# Patient Record
Sex: Male | Born: 1960 | Race: White | Hispanic: No | Marital: Single | Smoking: Former smoker
Health system: Southern US, Community
[De-identification: ages and names within clinical notes are randomized; demographics above are authoritative.]

## PROBLEM LIST (undated history)

## (undated) DIAGNOSIS — J189 Pneumonia, unspecified organism: Secondary | ICD-10-CM

## (undated) DIAGNOSIS — K649 Unspecified hemorrhoids: Secondary | ICD-10-CM

## (undated) DIAGNOSIS — C189 Malignant neoplasm of colon, unspecified: Secondary | ICD-10-CM

## (undated) DIAGNOSIS — N289 Disorder of kidney and ureter, unspecified: Secondary | ICD-10-CM

## (undated) DIAGNOSIS — R091 Pleurisy: Secondary | ICD-10-CM

## (undated) DIAGNOSIS — C787 Secondary malignant neoplasm of liver and intrahepatic bile duct: Secondary | ICD-10-CM

---

## 2012-10-25 ENCOUNTER — Encounter (HOSPITAL_BASED_OUTPATIENT_CLINIC_OR_DEPARTMENT_OTHER): Payer: Self-pay | Admitting: *Deleted

## 2012-10-25 ENCOUNTER — Emergency Department (HOSPITAL_BASED_OUTPATIENT_CLINIC_OR_DEPARTMENT_OTHER)
Admission: EM | Admit: 2012-10-25 | Discharge: 2012-10-25 | Disposition: A | Discharge status: Home / Self Care | Payer: Medicaid Other | Attending: Emergency Medicine | Admitting: Emergency Medicine

## 2012-10-25 DIAGNOSIS — K921 Melena: Secondary | ICD-10-CM | POA: Insufficient documentation

## 2012-10-25 DIAGNOSIS — Z8709 Personal history of other diseases of the respiratory system: Secondary | ICD-10-CM | POA: Insufficient documentation

## 2012-10-25 DIAGNOSIS — K649 Unspecified hemorrhoids: Secondary | ICD-10-CM | POA: Insufficient documentation

## 2012-10-25 DIAGNOSIS — Z87448 Personal history of other diseases of urinary system: Secondary | ICD-10-CM | POA: Insufficient documentation

## 2012-10-25 DIAGNOSIS — R197 Diarrhea, unspecified: Secondary | ICD-10-CM | POA: Insufficient documentation

## 2012-10-25 DIAGNOSIS — Z8701 Personal history of pneumonia (recurrent): Secondary | ICD-10-CM | POA: Insufficient documentation

## 2012-10-25 HISTORY — DX: Pleurisy: R09.1

## 2012-10-25 HISTORY — DX: Disorder of kidney and ureter, unspecified: N28.9

## 2012-10-25 HISTORY — DX: Unspecified hemorrhoids: K64.9

## 2012-10-25 HISTORY — DX: Pneumonia, unspecified organism: J18.9

## 2012-10-25 LAB — CBC WITH DIFFERENTIAL/PLATELET
Basophils Relative: 0 % (ref 0–1)
Eosinophils Absolute: 0.1 10*3/uL (ref 0.0–0.7)
HCT: 37.1 % — ABNORMAL LOW (ref 39.0–52.0)
Hemoglobin: 12.6 g/dL — ABNORMAL LOW (ref 13.0–17.0)
Lymphs Abs: 1.3 10*3/uL (ref 0.7–4.0)
MCH: 27.8 pg (ref 26.0–34.0)
MCHC: 34 g/dL (ref 30.0–36.0)
MCV: 81.9 fL (ref 78.0–100.0)
Monocytes Absolute: 0.5 10*3/uL (ref 0.1–1.0)
Monocytes Relative: 8 % (ref 3–12)
Neutrophils Relative %: 70 % (ref 43–77)
RBC: 4.53 MIL/uL (ref 4.22–5.81)

## 2012-10-25 LAB — BASIC METABOLIC PANEL
BUN: 9 mg/dL (ref 6–23)
Creatinine, Ser: 1 mg/dL (ref 0.50–1.35)
GFR calc non Af Amer: 87 mL/min — ABNORMAL LOW (ref >90)
Glucose, Bld: 107 mg/dL — ABNORMAL HIGH (ref 70–99)
Potassium: 3.6 mEq/L (ref 3.5–5.1)

## 2012-10-25 MED ORDER — SODIUM CHLORIDE 0.9 % IV BOLUS (SEPSIS)
1000.0000 mL | Freq: Once | INTRAVENOUS | Status: AC
  Administered 2012-10-25: 1000 mL via INTRAVENOUS

## 2012-10-25 MED ORDER — HYDROCORTISONE 2.5 % RE CREA: TOPICAL_CREAM | RECTAL | Status: AC

## 2012-10-25 MED ORDER — DIPHENOXYLATE-ATROPINE 2.5-0.025 MG PO TABS: 1.0000 | ORAL_TABLET | Freq: Four times a day (QID) | ORAL | Status: AC | PRN

## 2012-10-25 MED ORDER — DIPHENOXYLATE-ATROPINE 2.5-0.025 MG PO TABS
2.0000 | ORAL_TABLET | ORAL | Status: AC
  Administered 2012-10-25: 2 via ORAL
  Filled 2012-10-25: qty 2

## 2012-10-25 NOTE — ED Provider Notes (Signed)
Medical screening examination/treatment/procedure(s) were performed by non-physician practitioner and as supervising physician I was immediately available for consultation/collaboration.   Charles B. Bernette Mayers, MD 10/25/12 2211

## 2012-10-25 NOTE — ED Provider Notes (Signed)
History     CSN: [id number]  Arrival date & time 10/25/12  1924   First MD Initiated Contact with Patient 10/25/12 1951      Chief Complaint  Patient presents with  . Diarrhea    (Consider location/radiation/quality/duration/timing/severity/associated sxs/prior treatment) HPI Comments: Patient is a 52 year old male who presents with a 1 year history of diarrhea. Symptoms started gradually and remained intermittent over the past year. Patient denies known trigger. He has not tried anything for symptoms or seen a physician about this problem. Patient reports associated painful hemorrhoids and occasional blood in stool. No aggravating/alleviating factors. No other associated symptoms.   Patient is a 52 y.o. male presenting with diarrhea.  Diarrhea   Past Medical History  Diagnosis Date  . Hemorrhoid   . Pleurisy   . Pneumonia   . Renal disorder     History reviewed. No pertinent past surgical history.  History reviewed. No pertinent family history.  History  Substance Use Topics  . Smoking status: Never Smoker   . Smokeless tobacco: Not on file  . Alcohol Use: No      Review of Systems  Gastrointestinal: Positive for diarrhea and blood in stool.  All other systems reviewed and are negative.    Allergies  Morphine and related  Home Medications  No current outpatient prescriptions on file.  BP 148/99  Pulse 92  Temp(Src) 98.6 F (37 C) (Oral)  Resp 20  Ht 5' 10.5" (1.791 m)  Wt 215 lb (97.523 kg)  BMI 30.4 kg/m2  SpO2 98%  Physical Exam  Nursing note and vitals reviewed. Constitutional: He is oriented to person, place, and time. He appears well-developed and well-nourished. No distress.  HENT:  Head: Normocephalic and atraumatic.  Eyes: Conjunctivae are normal.  Neck: Normal range of motion.  Cardiovascular: Normal rate and regular rhythm.  Exam reveals no gallop and no friction rub.   No murmur heard. Pulmonary/Chest: Effort normal and breath  sounds normal. He has no wheezes. He has no rales. He exhibits no tenderness.  Abdominal: Soft. He exhibits no distension. There is no tenderness. There is no rebound and no guarding.  Genitourinary:  Hemorrhoid noted at 12 o'clock position of anus. No blood noted.   Musculoskeletal: Normal range of motion.  Neurological: He is alert and oriented to person, place, and time. Coordination normal.  Speech is goal-oriented. Moves limbs without ataxia.   Skin: Skin is warm and dry.  Psychiatric: He has a normal mood and affect. His behavior is normal.    ED Course  Procedures (including critical care time)  Labs Reviewed  CBC WITH DIFFERENTIAL - Abnormal; Notable for the following:    Hemoglobin 12.6 (*)    HCT 37.1 (*)    All other components within normal limits  BASIC METABOLIC PANEL - Abnormal; Notable for the following:    Glucose, Bld 107 (*)    GFR calc non Af Amer 87 (*)    All other components within normal limits   No results found.   1. Diarrhea   2. Hemorrhoids       MDM  10:02 PM Labs stable. Patient afebrile with stable vitals. Patient will be discharged with lomotil and instructions for hemorrhoid pain relief. Patient will have recommended GI follow up. Patient instructed to return with worsening or concerning symptoms.         Emilia Beck, [place] 10/25/12 2208

## 2012-10-25 NOTE — ED Notes (Signed)
Pt came to ED with wife that was transported by EMS. Pt states he has a hemorrhoid and has had diarrhea with blood in stool x 1 yr.

## 2013-08-23 ENCOUNTER — Emergency Department (HOSPITAL_BASED_OUTPATIENT_CLINIC_OR_DEPARTMENT_OTHER)
Admission: EM | Admit: 2013-08-23 | Discharge: 2013-08-23 | Disposition: A | Discharge status: Home / Self Care | Payer: Medicaid Other | Attending: Emergency Medicine | Admitting: Emergency Medicine

## 2013-08-23 ENCOUNTER — Emergency Department (HOSPITAL_BASED_OUTPATIENT_CLINIC_OR_DEPARTMENT_OTHER): Payer: Medicaid Other

## 2013-08-23 ENCOUNTER — Encounter (HOSPITAL_BASED_OUTPATIENT_CLINIC_OR_DEPARTMENT_OTHER): Payer: Self-pay | Admitting: Emergency Medicine

## 2013-08-23 DIAGNOSIS — Z85038 Personal history of other malignant neoplasm of large intestine: Secondary | ICD-10-CM | POA: Insufficient documentation

## 2013-08-23 DIAGNOSIS — C189 Malignant neoplasm of colon, unspecified: Secondary | ICD-10-CM | POA: Insufficient documentation

## 2013-08-23 DIAGNOSIS — Z87891 Personal history of nicotine dependence: Secondary | ICD-10-CM | POA: Insufficient documentation

## 2013-08-23 DIAGNOSIS — Z79899 Other long term (current) drug therapy: Secondary | ICD-10-CM | POA: Insufficient documentation

## 2013-08-23 DIAGNOSIS — Z8709 Personal history of other diseases of the respiratory system: Secondary | ICD-10-CM | POA: Insufficient documentation

## 2013-08-23 DIAGNOSIS — IMO0002 Reserved for concepts with insufficient information to code with codable children: Secondary | ICD-10-CM | POA: Insufficient documentation

## 2013-08-23 DIAGNOSIS — Y929 Unspecified place or not applicable: Secondary | ICD-10-CM | POA: Insufficient documentation

## 2013-08-23 DIAGNOSIS — Y9389 Activity, other specified: Secondary | ICD-10-CM | POA: Insufficient documentation

## 2013-08-23 DIAGNOSIS — Z8679 Personal history of other diseases of the circulatory system: Secondary | ICD-10-CM | POA: Insufficient documentation

## 2013-08-23 DIAGNOSIS — S298XXA Other specified injuries of thorax, initial encounter: Secondary | ICD-10-CM | POA: Insufficient documentation

## 2013-08-23 DIAGNOSIS — Z8701 Personal history of pneumonia (recurrent): Secondary | ICD-10-CM | POA: Insufficient documentation

## 2013-08-23 DIAGNOSIS — C787 Secondary malignant neoplasm of liver and intrahepatic bile duct: Secondary | ICD-10-CM | POA: Insufficient documentation

## 2013-08-23 DIAGNOSIS — S299XXA Unspecified injury of thorax, initial encounter: Secondary | ICD-10-CM

## 2013-08-23 DIAGNOSIS — X500XXA Overexertion from strenuous movement or load, initial encounter: Secondary | ICD-10-CM | POA: Insufficient documentation

## 2013-08-23 DIAGNOSIS — Z87448 Personal history of other diseases of urinary system: Secondary | ICD-10-CM | POA: Insufficient documentation

## 2013-08-23 HISTORY — DX: Malignant neoplasm of colon, unspecified: C18.9

## 2013-08-23 HISTORY — DX: Secondary malignant neoplasm of liver and intrahepatic bile duct: C78.7

## 2013-08-23 MED ORDER — OXYCODONE-ACETAMINOPHEN 5-325 MG PO TABS: 2.0000 | ORAL_TABLET | ORAL | Status: AC | PRN

## 2013-08-23 MED ORDER — HYDROMORPHONE HCL PF 1 MG/ML IJ SOLN
1.0000 mg | Freq: Once | INTRAMUSCULAR | Status: AC
  Administered 2013-08-23: 1 mg via INTRAMUSCULAR
  Filled 2013-08-23: qty 1

## 2013-08-23 NOTE — Discharge Instructions (Signed)
Return to the ED with any concerns including increased pain, difficulty breathing, fever/chills, or any other alarming symptoms  You should use the incentive spirometer approximately 10 times every hour

## 2013-08-23 NOTE — ED Notes (Signed)
Pt reports he crawled across the back seat of the car and now has pain on right lower rib margin- sore to touch

## 2013-08-23 NOTE — ED Provider Notes (Signed)
CSN: [id number]     Arrival date & time 08/23/13  1553 History  This chart was scribed for Threasa Beards, MD by Marcha Dutton, ED Scribe. This patient was seen in room MH11/MH11 and the patient's care was started at 4:28 PM.    Chief Complaint  Patient presents with  . Rib Injury      Patient is a 53 y.o. male presenting with chest pain. The history is provided by the patient. No language interpreter was used.  Chest Pain Pain location:  R chest Pain quality: aching and dull   Pain radiates to the back: no   Pain severity:  Moderate Onset quality:  Sudden Duration:  1 day Timing:  Constant Progression:  Unchanged Chronicity:  New Context: movement   Context comment:   Pt reports he reached across the back seat of the car to lock the opposite door when she felt a pain.  Relieved by:  Rest Worsened by:  Deep breathing, movement and exertion Pt reports he has colon cancer that has spread to the liver and has been on chemo. Pt reports he is allergic to morphine. Pt states he takes percocet already for pain denies ever taking more than 2 per day.  Past Medical History  Diagnosis Date  . Hemorrhoid   . Pleurisy   . Pneumonia   . Renal disorder   . Colon cancer metastasized to liver    History reviewed. No pertinent past surgical history. No family history on file. History  Substance Use Topics  . Smoking status: Former Research scientist (life sciences)  . Smokeless tobacco: Never Used  . Alcohol Use: No    Review of Systems  Cardiovascular: Positive for chest pain.  All other systems reviewed and are negative.     Allergies  Morphine and related  Home Medications   Current Outpatient Rx  Name  Route  Sig  Dispense  Refill  . hydrochlorothiazide (HYDRODIURIL) 12.5 MG tablet   Oral   Take 12.5 mg by mouth daily.         . methadone (DOLOPHINE) 5 MG tablet   Oral   Take 5 mg by mouth daily.         Marland Kitchen oxyCODONE-acetaminophen (PERCOCET) 10-325 MG per tablet   Oral   Take 1  tablet by mouth every 4 (four) hours as needed for pain.         . diphenoxylate-atropine (LOMOTIL) 2.5-0.025 MG per tablet   Oral   Take 1 tablet by mouth 4 (four) times daily as needed for diarrhea or loose stools.   30 tablet   0   . hydrocortisone (ANUSOL-HC) 2.5 % rectal cream      Apply rectally 2 times daily   30 g   0   . oxyCODONE-acetaminophen (ROXICET) 5-325 MG per tablet   Oral   Take 2 tablets by mouth every 4 (four) hours as needed for severe pain.   30 tablet   0    Triage Vitals: BP 175/116  Pulse 82  Temp(Src) 98.2 F (36.8 C) (Oral)  Resp 18  Ht 5\' 10"  (1.778 m)  Wt 210 lb (95.255 kg)  BMI 30.13 kg/m2  SpO2 100%  Physical Exam  Nursing note and vitals reviewed. Constitutional: He appears well-developed and well-nourished. No distress.  HENT:  Head: Normocephalic and atraumatic.  Right Ear: External ear normal.  Left Ear: External ear normal.  Eyes: Conjunctivae are normal. Right eye exhibits no discharge. Left eye exhibits no discharge. No scleral  icterus.  Neck: Neck supple. No tracheal deviation present.  Cardiovascular: Normal rate, regular rhythm and intact distal pulses.   Pulmonary/Chest: Effort normal and breath sounds normal. No stridor. No respiratory distress. He has no wheezes. He has no rales. He exhibits tenderness (Tenderness to palpation over right anterior lower ribs). He exhibits no crepitus.  no crepitus, br sounds symmetric, appears uncomfortable with movement  Abdominal: Soft. Bowel sounds are normal. He exhibits no distension. There is tenderness. There is no rebound and no guarding.  Musculoskeletal: He exhibits no edema and no tenderness.  Neurological: He is alert. He has normal strength. No cranial nerve deficit (no facial droop, extraocular movements intact, no slurred speech) or sensory deficit. He exhibits normal muscle tone. He displays no seizure activity. Coordination normal.  Skin: Skin is warm and dry. No rash noted.   Psychiatric: He has a normal mood and affect.    ED Course  Procedures (including critical care time)  DIAGNOSTIC STUDIES: Oxygen Saturation is 100% on RA, normal by my interpretation.    COORDINATION OF CARE: 4:32 PM- Pt advised of plan for treatment and pt agrees.    Labs Review Labs Reviewed - No data to display Imaging Review No results found.   EKG Interpretation None      MDM   Final diagnoses:  Rib injury    Pt presenting with c/o pain in right lower rib margin- he states pain began when stretching across a back seat.  No signs of rib fracture on xray.  Pt is in no distress.  No ptx.  Discharged with strict return precautions.  Pt agreeable with plan.  I personally performed the services described in this documentation, which was scribed in my presence. The recorded information has been reviewed and is accurate.    Threasa Beards, MD 08/26/13 [id number]

## 2014-06-09 IMAGING — CR DG RIBS W/ CHEST 3+V*R*
5 series · 5 of 5 positions shown · non-contrast
Comparison: None.

CLINICAL DATA: Right rib pain. Previously diagnosed metastatic
rectal carcinoma.

EXAM:
RIGHT RIBS AND CHEST - 3+ VIEW

[w chest pa]
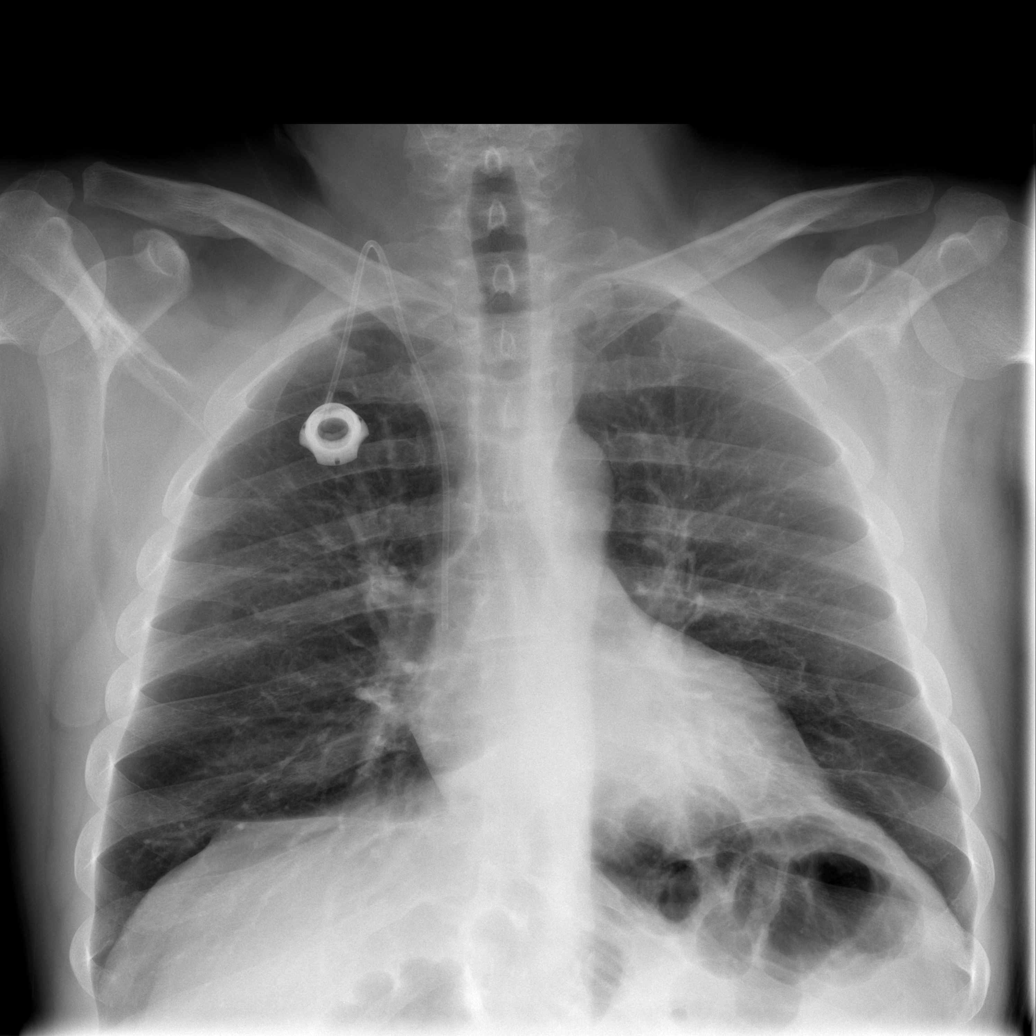

[w ribs ap/pa upper right]
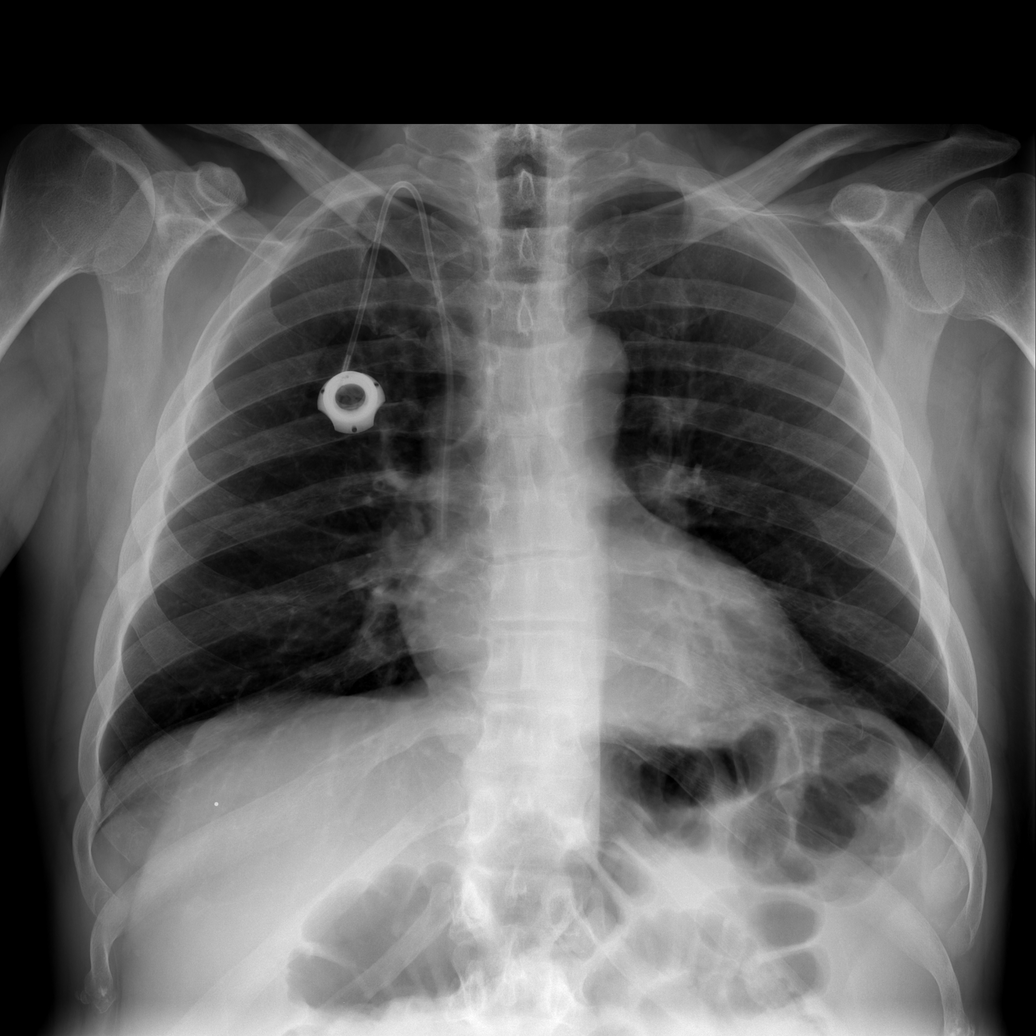

[w ribs ap/pa lower right]
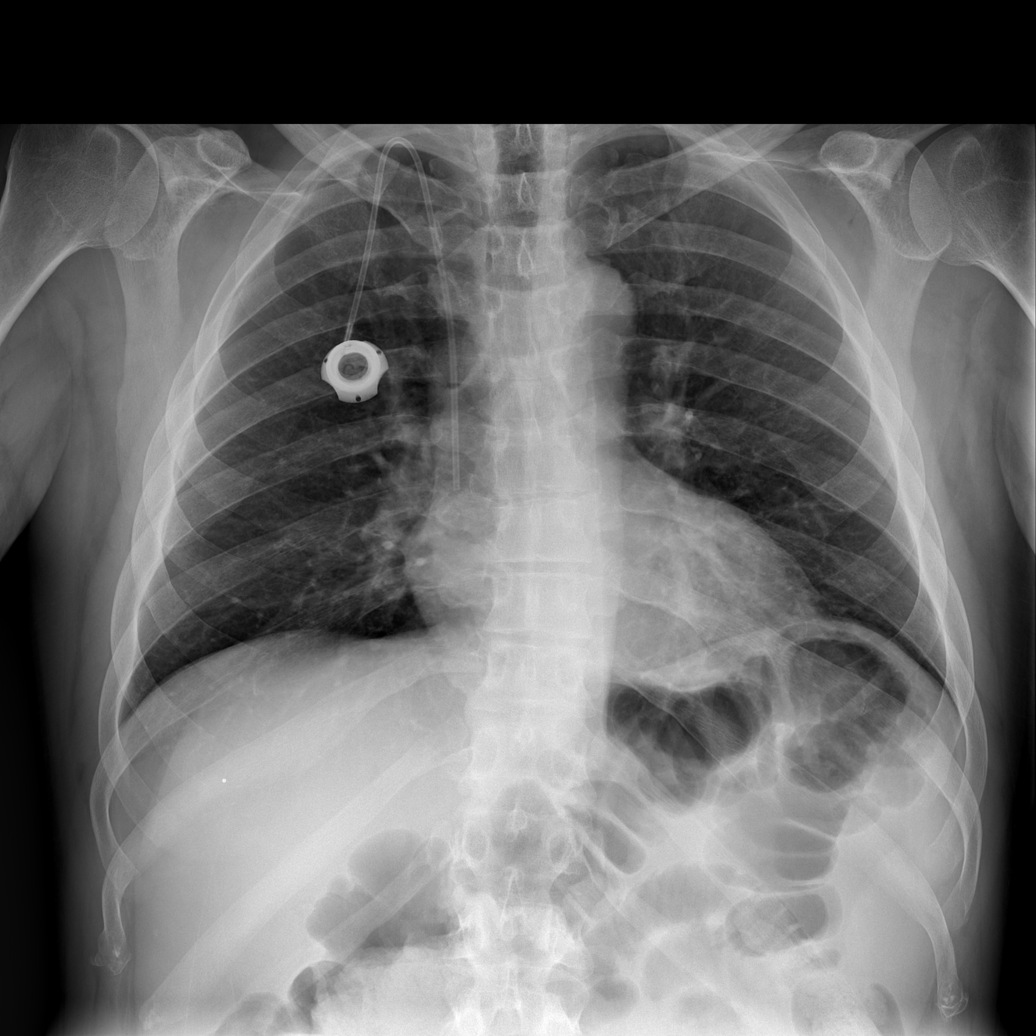

[w ribs oblique right (1 of 2)]
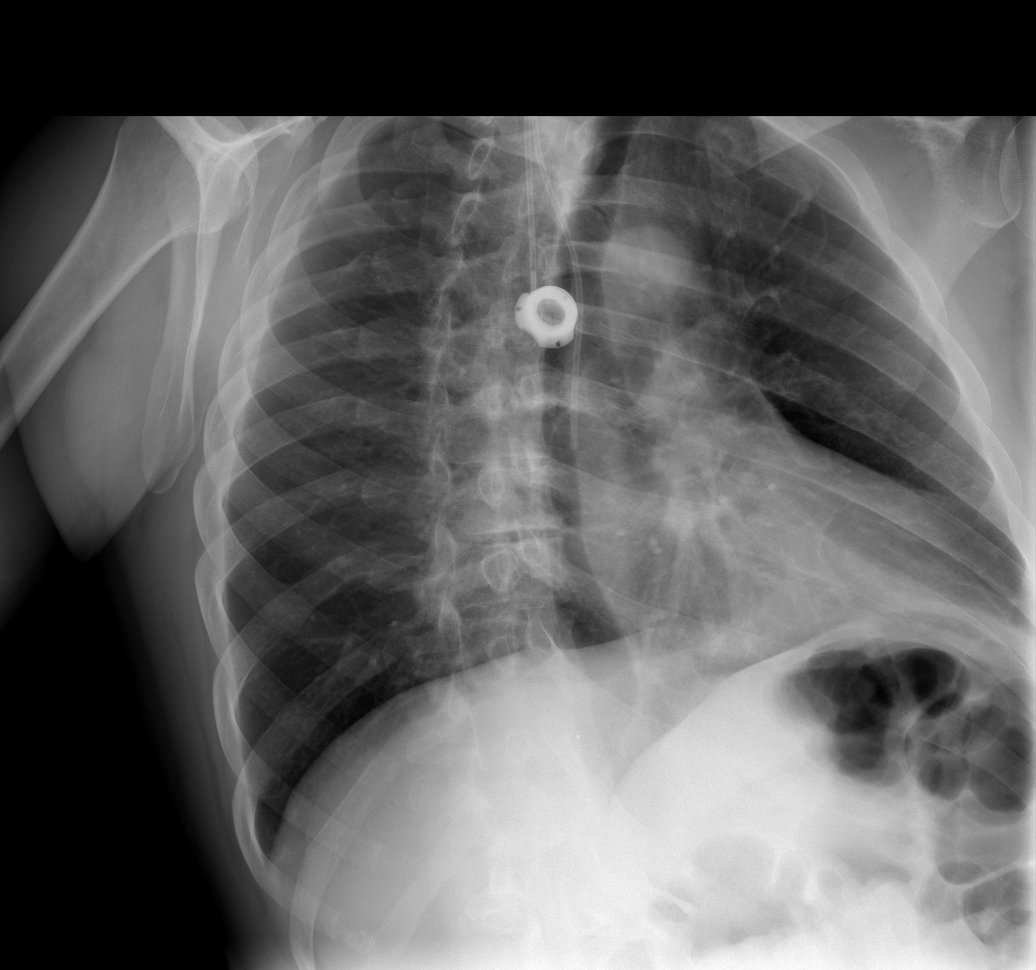

[w ribs oblique right (2 of 2)]
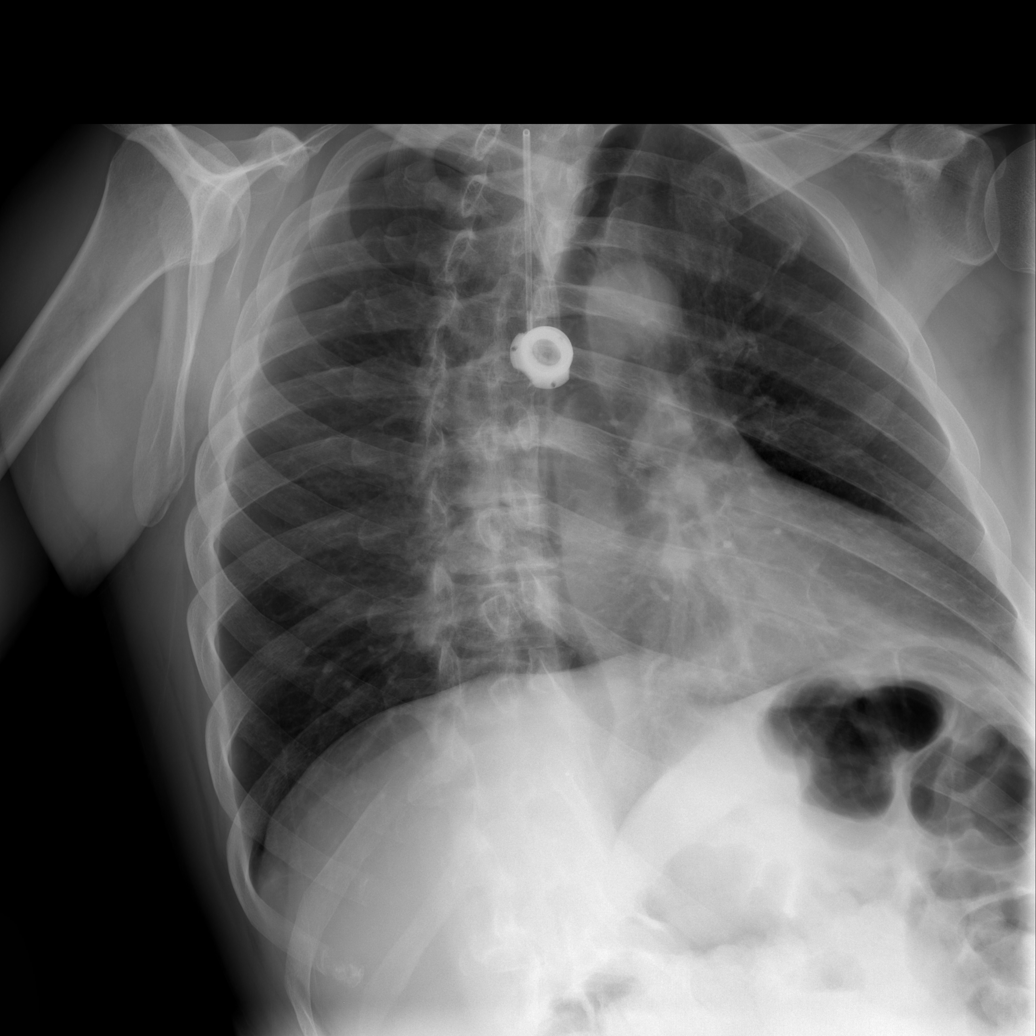

[5 of 5 positions shown; findings below may reference images not displayed]

FINDINGS: No fracture or other bone lesions are seen involving the ribs. There
is no evidence of pneumothorax or pleural effusion. Both lungs are
clear. Heart size and mediastinal contours are within normal limits.
Right-sided Port-A-Cath tip terminates over the right atrium.
IMPRESSION: Negative.

## 2015-02-12 DEATH — deceased
